# Patient Record
Sex: Male | Born: 1985 | Race: White | Hispanic: No | Marital: Single | Smoking: Current every day smoker
Health system: Southern US, Community
[De-identification: ages and names within clinical notes are randomized; demographics above are authoritative.]

## PROBLEM LIST (undated history)

## (undated) DIAGNOSIS — J45909 Unspecified asthma, uncomplicated: Secondary | ICD-10-CM

---

## 2006-10-10 ENCOUNTER — Emergency Department: Payer: Self-pay | Admitting: Emergency Medicine

## 2007-10-21 ENCOUNTER — Emergency Department: Payer: Self-pay | Admitting: Emergency Medicine

## 2008-11-06 ENCOUNTER — Emergency Department: Payer: Self-pay | Admitting: Emergency Medicine

## 2010-08-04 ENCOUNTER — Emergency Department: Payer: Self-pay | Admitting: Emergency Medicine

## 2011-03-31 ENCOUNTER — Emergency Department: Payer: Self-pay | Admitting: Emergency Medicine

## 2011-12-10 ENCOUNTER — Emergency Department: Payer: Self-pay | Admitting: Emergency Medicine

## 2012-04-10 IMAGING — CR DG FOOT COMPLETE 3+V*L*
1 series · 3 of 3 positions shown · non-contrast
Comparison: none

REASON FOR EXAM: trauma
COMMENTS:

PROCEDURE:     DXR - DXR FOOT LT COMP W/OBLIQUES  - March 31, 2011  [DATE]
RESULT:     Images of the left second foot demonstrate no fracture,
dislocation or radiopaque foreign body.

[Series 1: view not recorded · 0.17mm/px · 3 of 3 slices shown]
[im 1/3]
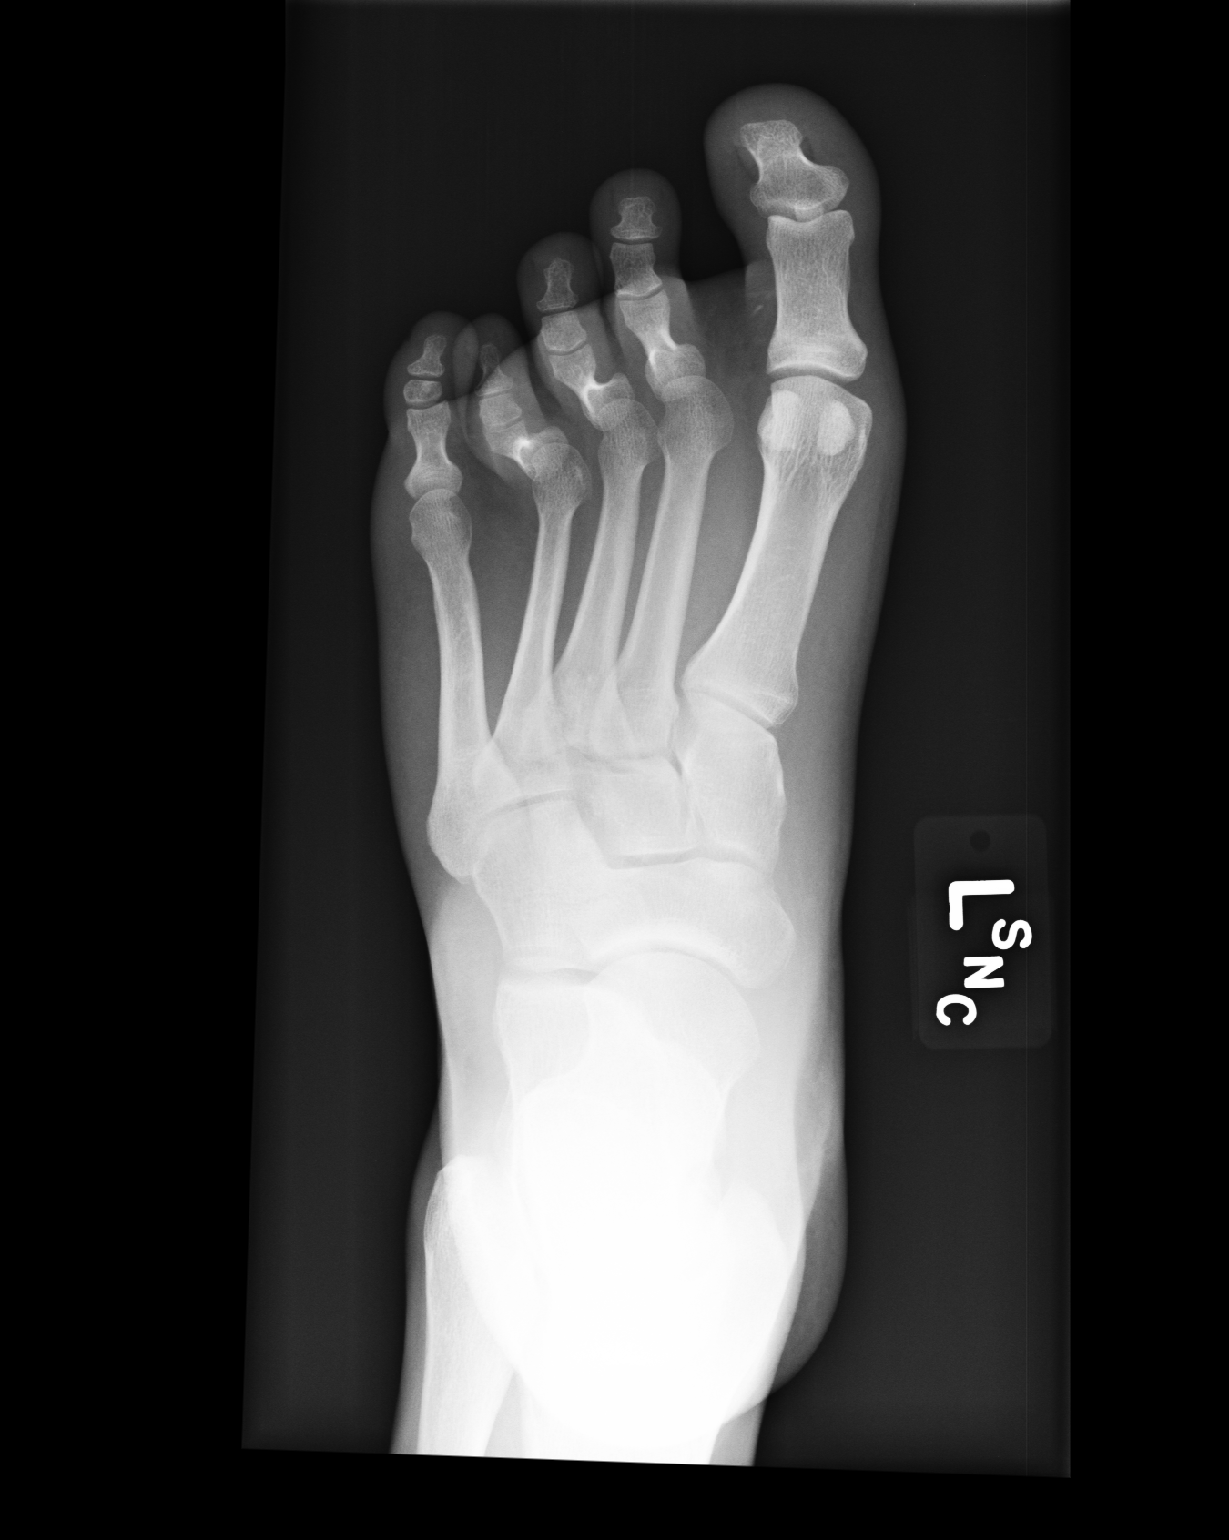
[im 2/3]
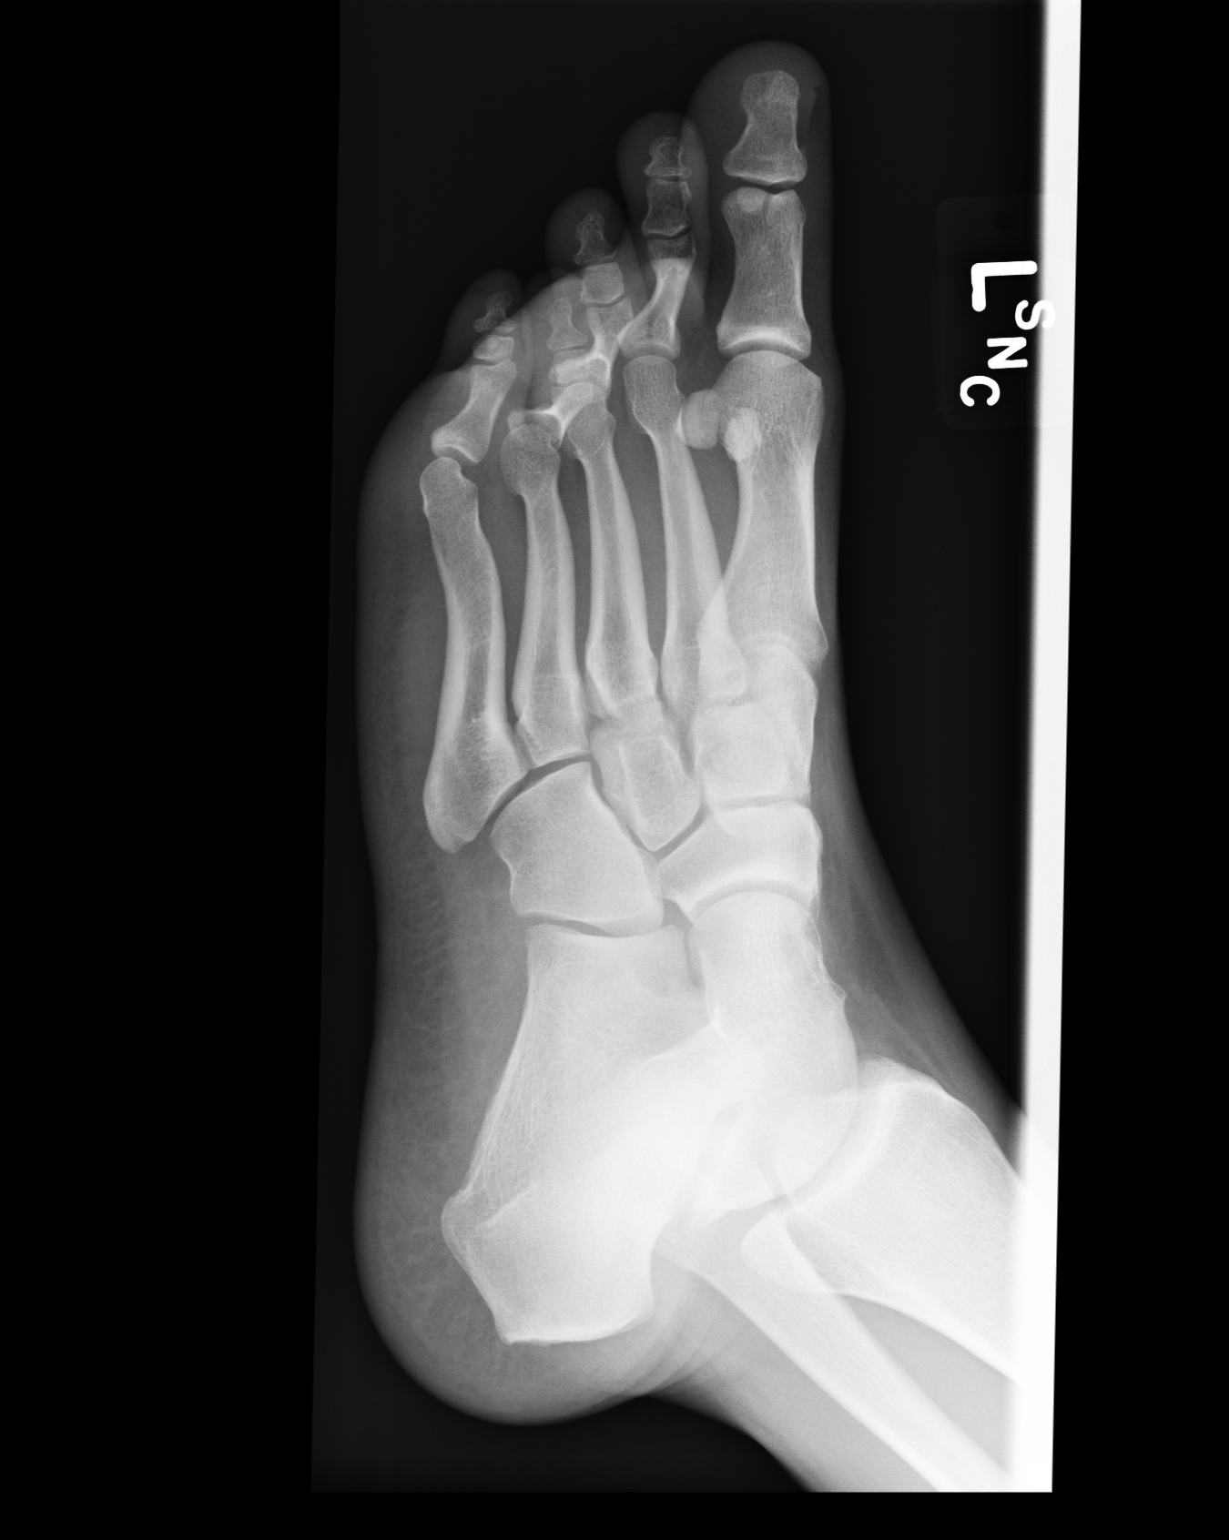
[im 3/3]
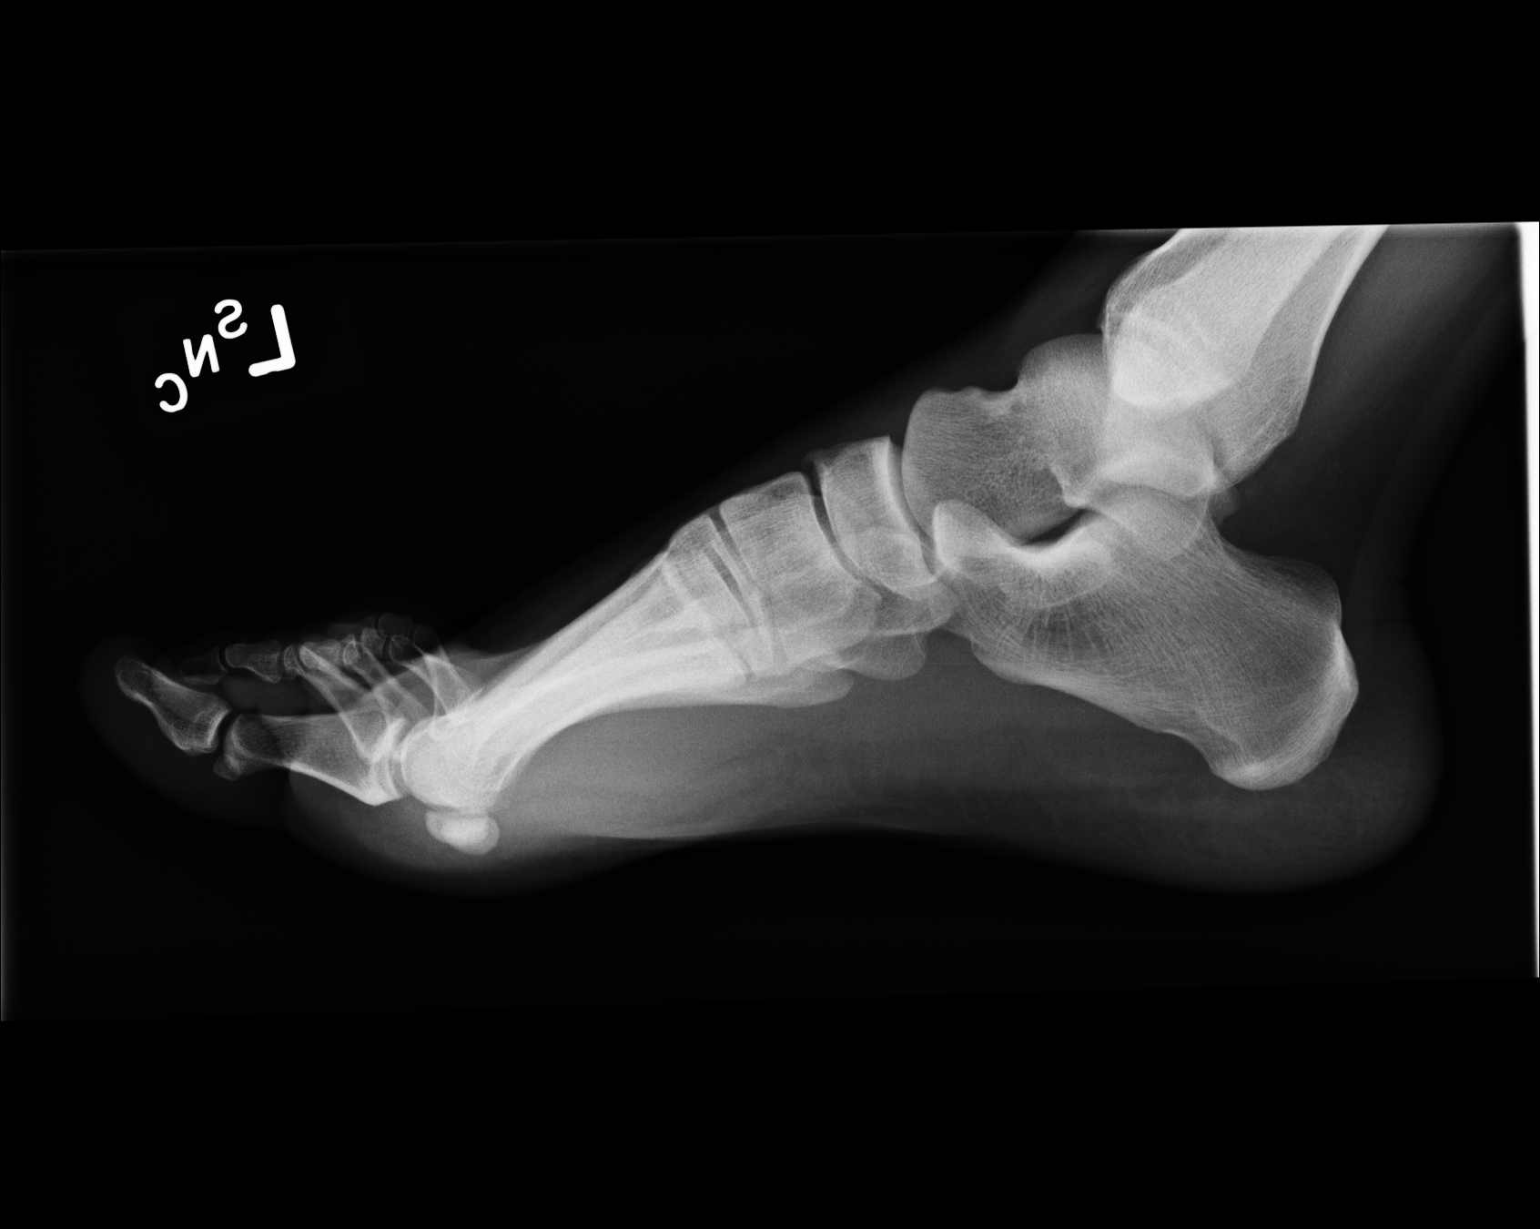

[3 of 3 positions shown; findings below may reference images not displayed]

IMPRESSION: Please see above.

## 2013-10-02 ENCOUNTER — Emergency Department: Payer: Self-pay | Admitting: Emergency Medicine

## 2014-07-04 ENCOUNTER — Emergency Department: Payer: Self-pay | Admitting: Emergency Medicine

## 2015-08-16 ENCOUNTER — Emergency Department
Admission: EM | Admit: 2015-08-16 | Discharge: 2015-08-16 | Disposition: A | Discharge status: Home / Self Care | Payer: Self-pay | Attending: Emergency Medicine | Admitting: Emergency Medicine

## 2015-08-16 ENCOUNTER — Emergency Department: Payer: Self-pay

## 2015-08-16 DIAGNOSIS — J45909 Unspecified asthma, uncomplicated: Secondary | ICD-10-CM | POA: Insufficient documentation

## 2015-08-16 DIAGNOSIS — Z720 Tobacco use: Secondary | ICD-10-CM | POA: Insufficient documentation

## 2015-08-16 DIAGNOSIS — J159 Unspecified bacterial pneumonia: Secondary | ICD-10-CM | POA: Insufficient documentation

## 2015-08-16 DIAGNOSIS — J189 Pneumonia, unspecified organism: Secondary | ICD-10-CM

## 2015-08-16 HISTORY — DX: Unspecified asthma, uncomplicated: J45.909

## 2015-08-16 MED ORDER — AZITHROMYCIN 250 MG PO TABS: ORAL_TABLET | ORAL | Status: DC

## 2015-08-16 MED ORDER — BENZONATATE 100 MG PO CAPS: ORAL_CAPSULE | ORAL | Status: DC

## 2015-08-16 NOTE — Discharge Instructions (Signed)
Community-Acquired Pneumonia, Adult Pneumonia is an infection of the lungs. One type of pneumonia can happen while a person is in a hospital. A different type can happen when a person is not in a hospital (community-acquired pneumonia). It is easy for this kind to spread from person to person. It can spread to you if you breathe near an infected person who coughs or sneezes. Some symptoms include:  A dry cough.  A wet (productive) cough.  Fever.  Sweating.  Chest pain. HOME CARE  Take over-the-counter and prescription medicines only as told by your doctor.  Only take cough medicine if you are losing sleep.  If you were prescribed an antibiotic medicine, take it as told by your doctor. Do not stop taking the antibiotic even if you start to feel better.  Sleep with your head and neck raised (elevated). You can do this by putting a few pillows under your head, or you can sleep in a recliner.  Do not use tobacco products. These include cigarettes, chewing tobacco, and e-cigarettes. If you need help quitting, ask your doctor.  Drink enough water to keep your pee (urine) clear or pale yellow. A shot (vaccine) can help prevent pneumonia. Shots are often suggested for:  People older than 28 years of age.  People older than 28 years of age:  Who are having cancer treatment.  Who have long-term (chronic) lung disease.  Who have problems with their body's defense system (immune system). You may also prevent pneumonia if you take these actions:  Get the flu (influenza) shot every year.  Go to the dentist as often as told.  Wash your hands often. If soap and water are not available, use hand sanitizer. GET HELP IF:  You have a fever.  You lose sleep because your cough medicine does not help. GET HELP RIGHT AWAY IF:  You are short of breath and it gets worse.  You have more chest pain.  Your sickness gets worse. This is very serious if:  You are an older adult.  Your  body's defense system is weak.  You cough up blood.   This information is not intended to replace advice given to you by your health care provider. Make sure you discuss any questions you have with your health care provider.   Document Released: 04/06/2008 Document Revised: 07/10/2015 Document Reviewed: 02/13/2015 Elsevier Interactive Patient Education [employer]2016 Elsevier Inc.   Take antibiotics as directed. Zithromax is for 5 days although her stay in her system for 10 days. Tessalon as needed for cough. You should follow-up with [facility] acute care if any continued problems and for follow-up in 1-2 weeks.

## 2015-08-16 NOTE — ED Notes (Signed)
Pt c/o cough with congestion for the past week and has been taking OTC meds without relief

## 2015-08-16 NOTE — ED Provider Notes (Signed)
[facility] Emergency Department Provider Note  ____________________________________________  Time seen: Approximately 10:45 AM  I have reviewed the triage vital signs and the nursing notes.   HISTORY  Chief Complaint Cough  HPI Micheal Payne is a 29 y.o. male is here with complaint of cough and congestion for 2 weeks. Patient states that he's had green nasal congestion along with a productive cough. Patient states he has had chills but no fever. He has been taking over-the-counter medication without any improvement. He states in the past he has had both bronchitis and pneumonia. He denies any pain at this time and denies any chest pain.   Past Medical History  Diagnosis Date  . Asthma     There are no active problems to display for this patient.   History reviewed. No pertinent past surgical history.  Current Outpatient Rx  Name  Route  Sig  Dispense  Refill  . azithromycin (ZITHROMAX Z-PAK) 250 MG tablet      Take 2 tablets (500 mg) on  Day 1,  followed by 1 tablet (250 mg) once daily on Days 2 through 5.   6 each   0   . benzonatate (TESSALON PERLES) 100 MG capsule      Take 1-2 q 8 hours prn cough   40 capsule   0     Allergies Sulfa antibiotics  No family history on file.  Social History Social History  Substance Use Topics  . Smoking status: Current Every Day Smoker    Types: Cigarettes  . Smokeless tobacco: None  . Alcohol Use: Yes    Review of Systems Constitutional: No fever/positive chills Eyes: No visual changes. ENT: No sore throat. Cardiovascular: Denies chest pain. Respiratory: Denies shortness of breath. Gastrointestinal:   No nausea, no vomiting.  No diarrhea.   Musculoskeletal: Negative for back pain. Skin: Negative for rash. Neurological: Negative for headaches, focal weakness or numbness.  10-point ROS otherwise negative.  ____________________________________________   PHYSICAL EXAM:  VITAL SIGNS: ED  Triage Vitals  Enc Vitals Group     BP 08/16/15 1012 145/72 mmHg     Pulse Rate 08/16/15 1012 77     Resp 08/16/15 1012 18     Temp 08/16/15 1012 98.1 F (36.7 C)     Temp Source 08/16/15 1012 Oral     SpO2 08/16/15 1012 98 %     Weight 08/16/15 1012 190 lb (86.183 kg)     Height 08/16/15 1012 6' (1.829 m)     Head Cir --      Peak Flow --      Pain Score --      Pain Loc --      Pain Edu? --      Excl. in GC? --     Constitutional: Alert and oriented. Well appearing and in no acute distress. Eyes: Conjunctivae are normal. PERRL. EOMI. Head: Atraumatic. Nose: Moderate congestion/no visible rhinnorhea. Mouth/Throat: Mucous membranes are moist.  Oropharynx non-erythematous. Neck: No stridor.   Hematological/Lymphatic/Immunilogical: No cervical lymphadenopathy. Cardiovascular: Normal rate, regular rhythm. Grossly normal heart sounds.  Good peripheral circulation. Respiratory: Normal respiratory effort.  No retractions. Lungs CTAB with Coarse congested cough present. Gastrointestinal: Soft and nontender. No distention.  Musculoskeletal: No lower extremity tenderness nor edema.  No joint effusions. Neurologic:  Normal speech and language. No gross focal neurologic deficits are appreciated. No gait instability. Skin:  Skin is warm, dry and intact. No rash noted. Psychiatric: Mood and affect are normal. Speech  and behavior are normal.  ____________________________________________   LABS (all labs ordered are listed, but only abnormal results are displayed)  Labs Reviewed - No data to display RADIOLOGY  Chest x-ray per radiologist shows left lung base concerning for pneumonia. I, Tommi Rumpshonda L Summers, personally viewed and evaluated these images (plain radiographs) as part of my medical decision making.  ____________________________________________   PROCEDURES  Procedure(s) performed: None  Critical Care performed:  No  ____________________________________________   INITIAL IMPRESSION / ASSESSMENT AND PLAN / ED COURSE  Pertinent labs & imaging results that were available during my care of the patient were reviewed by me and considered in my medical decision making (see chart for details)  Patient will follow-up with [facility] in 1-2 weeks or return to the emergency room if any severe worsening of his symptoms. He was started on Zithromax and Tessalon pearls as needed for cough. He was also given a note for work.   ____________________________________________   FINAL CLINICAL IMPRESSION(S) / ED DIAGNOSES  Final diagnoses:  Community acquired pneumonia  left lower lung    Tommi RumpsRhonda L Summers, PA-C 08/16/15 1226  Sharyn CreamerMark Quale, MD 08/16/15 1504

## 2015-12-23 ENCOUNTER — Encounter: Payer: Self-pay | Admitting: *Deleted

## 2015-12-23 ENCOUNTER — Emergency Department
Admission: EM | Admit: 2015-12-23 | Discharge: 2015-12-23 | Disposition: A | Discharge status: Home / Self Care | Payer: Self-pay | Attending: Emergency Medicine | Admitting: Emergency Medicine

## 2015-12-23 ENCOUNTER — Emergency Department: Payer: Self-pay

## 2015-12-23 DIAGNOSIS — M25462 Effusion, left knee: Secondary | ICD-10-CM | POA: Insufficient documentation

## 2015-12-23 DIAGNOSIS — F17210 Nicotine dependence, cigarettes, uncomplicated: Secondary | ICD-10-CM | POA: Insufficient documentation

## 2015-12-23 MED ORDER — IBUPROFEN 800 MG PO TABS: 800.0000 mg | ORAL_TABLET | Freq: Three times a day (TID) | ORAL | Status: AC | PRN

## 2015-12-23 MED ORDER — IBUPROFEN 800 MG PO TABS
800.0000 mg | ORAL_TABLET | Freq: Once | ORAL | Status: AC
  Administered 2015-12-23: 800 mg via ORAL
  Filled 2015-12-23: qty 1

## 2015-12-23 NOTE — ED Provider Notes (Signed)
[facility] Emergency Department Provider Note  ____________________________________________  Time seen: Approximately 2:02 PM  I have reviewed the triage vital signs and the nursing notes.   HISTORY  Chief Complaint Leg Pain    HPI Micheal Payne is a 30 y.o. male who presents for evaluation of left knee swollen moderately. Patient states that he was playing disc office today woke up with severe left knee pain. Patient states he usually has high tolerance to this but more than tolerated. Has not tried anything over-the-counter at this moment. Pain worse with ambulation and movement. Describes pain as a 7/10 nonradiating.   Past Medical History  Diagnosis Date  . Asthma     There are no active problems to display for this patient.   History reviewed. No pertinent past surgical history.  Current Outpatient Rx  Name  Route  Sig  Dispense  Refill  . ibuprofen (ADVIL,MOTRIN) 800 MG tablet   Oral   Take 1 tablet (800 mg total) by mouth every 8 (eight) hours as needed.   30 tablet   0     Allergies Sulfa antibiotics  No family history on file.  Social History Social History  Substance Use Topics  . Smoking status: Current Every Day Smoker    Types: Cigarettes  . Smokeless tobacco: None  . Alcohol Use: Yes    Review of Systems Constitutional: No fever/chills Musculoskeletal: Positive for left knee pain. Skin: Negative for rash. Neurological: Negative for headaches, focal weakness or numbness.  10-point ROS otherwise negative.  ____________________________________________   PHYSICAL EXAM:  VITAL SIGNS: ED Triage Vitals  Enc Vitals Group     BP 12/23/15 1332 130/78 mmHg     Pulse Rate 12/23/15 1332 78     Resp 12/23/15 1332 20     Temp 12/23/15 1332 97.7 F (36.5 C)     Temp Source 12/23/15 1332 Oral     SpO2 12/23/15 1332 99 %     Weight 12/23/15 1332 190 lb (86.183 kg)     Height 12/23/15 1332  (1.854 m)     Head Cir --       Peak Flow --      Pain Score 12/23/15 1333 7     Pain Loc --      Pain Edu? --      Excl. in GC? --     Constitutional: Alert and oriented. Well appearing and in no acute distress. Cardiovascular: Normal rate, regular rhythm. Grossly normal heart sounds.  Good peripheral circulation. Respiratory: Normal respiratory effort.  No retractions. Lungs CTAB. Musculoskeletal: No lower extremity tenderness nor edema.  No joint effusions. Neurologic:  Normal speech and language. No gross focal neurologic deficits are appreciated. No gait instability. Skin:  Skin is warm, dry and intact. No rash noted. Psychiatric: Mood and affect are normal. Speech and behavior are normal.  ____________________________________________   LABS (all labs ordered are listed, but only abnormal results are displayed)  Labs Reviewed - No data to display  RADIOLOGY  FINDINGS: The joint spaces are maintained. Minimal spurring changes noted off the lateral tibial plateau. No osteochondral abnormality or acute fracture. There is a small suprapatellar knee joint effusion noted.  IMPRESSION: No acute bony findings.  Minimal lateral spurring changes.  Small joint effusion. ____________________________________________   PROCEDURES  Procedure(s) performed: None  Critical Care performed: No  ____________________________________________   INITIAL IMPRESSION / ASSESSMENT AND PLAN / ED COURSE  Pertinent labs & imaging results that were available during  my care of the patient were reviewed by me and considered in my medical decision making (see chart for details).  Acute left knee effusion. Rx given for crutches to help with ambulation. Avoid excessive use of weightbearing. Work excuse 24 hours given. Patient started on Motrin 800 mg 3 times a day and referral to orthopedics on-call for follow-up. ____________________________________________   FINAL CLINICAL IMPRESSION(S) / ED DIAGNOSES  Final  diagnoses:  Knee effusion, left     This chart was dictated using voice recognition software/Dragon. Despite best efforts to proofread, errors can occur which can change the meaning. Any change was purely unintentional.   Evangeline Dakin, PA-C 12/23/15 1515  Governor Rooks, MD 12/23/15 [phone]

## 2015-12-23 NOTE — ED Notes (Signed)
Left knee swollen moderately.  Patient states pain goes around knee.

## 2015-12-23 NOTE — Discharge Instructions (Signed)
Knee Effusion °Knee effusion means that you have excess fluid in your knee joint. This can cause pain and swelling in your knee. This may make your knee more difficult to bend and move. That is because there is increased pain and pressure in the joint. If there is fluid in your knee, it often means that something is wrong inside your knee, such as severe arthritis, abnormal inflammation, or an infection. Another common cause of knee effusion is an injury to the knee muscles, ligaments, or cartilage. °HOME CARE INSTRUCTIONS °· Use crutches as directed by your health care provider. °· Wear a knee brace as directed by your health care provider. °· Apply ice to the swollen area: °¨ Put ice in a plastic bag. °¨ Place a towel between your skin and the bag. °¨ Leave the ice on for 20 minutes, 2-3 times per day. °· Keep your knee raised (elevated) when you are sitting or lying down. °· Take medicines only as directed by your health care provider. °· Do any rehabilitation or strengthening exercises as directed by your health care provider. °· Rest your knee as directed by your health care provider. You may start doing your normal activities again when your health care provider approves.    °· Keep all follow-up visits as directed by your health care provider. This is important. °SEEK MEDICAL CARE IF: °· You have ongoing (persistent) pain in your knee. °SEEK IMMEDIATE MEDICAL CARE IF: °· You have increased swelling or redness of your knee. °· You have severe pain in your knee. °· You have a fever. °  °This information is not intended to replace advice given to you by your health care provider. Make sure you discuss any questions you have with your health care provider. °  °Document Released: 01/09/2004 Document Revised: 11/09/2014 Document Reviewed: 06/04/2014 °Elsevier Interactive Patient Education ©2016 Elsevier Inc. ° °

## 2015-12-23 NOTE — ED Notes (Signed)
Pt was playing disc golf yesterday , woke up today with left leg pain

## 2016-03-01 ENCOUNTER — Emergency Department
Admission: EM | Admit: 2016-03-01 | Discharge: 2016-03-01 | Disposition: A | Discharge status: Home / Self Care | Payer: Self-pay | Attending: Emergency Medicine | Admitting: Emergency Medicine

## 2016-03-01 DIAGNOSIS — J45909 Unspecified asthma, uncomplicated: Secondary | ICD-10-CM | POA: Insufficient documentation

## 2016-03-01 DIAGNOSIS — Y999 Unspecified external cause status: Secondary | ICD-10-CM | POA: Insufficient documentation

## 2016-03-01 DIAGNOSIS — F17210 Nicotine dependence, cigarettes, uncomplicated: Secondary | ICD-10-CM | POA: Insufficient documentation

## 2016-03-01 DIAGNOSIS — W57XXXA Bitten or stung by nonvenomous insect and other nonvenomous arthropods, initial encounter: Secondary | ICD-10-CM | POA: Insufficient documentation

## 2016-03-01 DIAGNOSIS — Y939 Activity, unspecified: Secondary | ICD-10-CM | POA: Insufficient documentation

## 2016-03-01 DIAGNOSIS — Y929 Unspecified place or not applicable: Secondary | ICD-10-CM | POA: Insufficient documentation

## 2016-03-01 DIAGNOSIS — S70361A Insect bite (nonvenomous), right thigh, initial encounter: Secondary | ICD-10-CM | POA: Insufficient documentation

## 2016-03-01 LAB — CBC WITH DIFFERENTIAL/PLATELET
BASOS PCT: 1 %
Basophils Absolute: 0.1 10*3/uL (ref 0–0.1)
EOS PCT: 1 %
Eosinophils Absolute: 0.1 10*3/uL (ref 0–0.7)
HCT: 45.2 % (ref 40.0–52.0)
Hemoglobin: 15.7 g/dL (ref 13.0–18.0)
LYMPHS ABS: 1.8 10*3/uL (ref 1.0–3.6)
Lymphocytes Relative: 29 %
MCH: 32.5 pg (ref 26.0–34.0)
MCHC: 34.8 g/dL (ref 32.0–36.0)
MCV: 93.2 fL (ref 80.0–100.0)
MONO ABS: 0.9 10*3/uL (ref 0.2–1.0)
Monocytes Relative: 15 %
Neutro Abs: 3.4 10*3/uL (ref 1.4–6.5)
Neutrophils Relative %: 54 %
PLATELETS: 89 10*3/uL — AB (ref 150–440)
RBC: 4.85 MIL/uL (ref 4.40–5.90)
RDW: 13 % (ref 11.5–14.5)
WBC: 6.3 10*3/uL (ref 3.8–10.6)

## 2016-03-01 LAB — COMPREHENSIVE METABOLIC PANEL
ALK PHOS: 71 U/L (ref 38–126)
ALT: 61 U/L (ref 17–63)
AST: 47 U/L — AB (ref 15–41)
Albumin: 4.5 g/dL (ref 3.5–5.0)
Anion gap: 10 (ref 5–15)
BUN: 13 mg/dL (ref 6–20)
CHLORIDE: 105 mmol/L (ref 101–111)
CO2: 28 mmol/L (ref 22–32)
CREATININE: 0.89 mg/dL (ref 0.61–1.24)
Calcium: 9.5 mg/dL (ref 8.9–10.3)
GFR calc Af Amer: >60 mL/min (ref >60)
Glucose, Bld: 82 mg/dL (ref 65–99)
Potassium: 4.4 mmol/L (ref 3.5–5.1)
Sodium: 143 mmol/L (ref 135–145)
Total Bilirubin: 1 mg/dL (ref 0.3–1.2)
Total Protein: 7.6 g/dL (ref 6.5–8.1)

## 2016-03-01 NOTE — ED Notes (Signed)
MD at bedside. 

## 2016-03-01 NOTE — ED Notes (Signed)
Patient stated that he took Goody's powder for pain and fever one hour prior to arrival.

## 2016-03-01 NOTE — ED Notes (Signed)
Patient presents to the ED with fever, headache and body aches since he pulled a tick off himself on Tuesday.  Patient states tick was very small.  Patient is in no obvious distress at this time.

## 2016-03-01 NOTE — ED Provider Notes (Signed)
[facility] Emergency Department Provider Note   ____________________________________________  Time seen: Approximately 1:52 PM  I have reviewed the triage vital signs and the nursing notes.   HISTORY  Chief Complaint Fever and Headache    HPI Micheal Payne is a 30 y.o. male patient reports he pulled a small tick off of himself on the inner upper thigh on the right Tuesday which was 6 days ago. Since then he has developed diffuse muscle aches headache and a fever. The fevers only 99 here but still he's had that off and on since. Where he pulled a tick off is not infected there is no sign of any erythema he said he did pinch the area to make it stop itching and there is a bruise from the pinch. Patient also says she thought a bug flew into his right ear yesterday it has been bothering him since he wanted me to make sure there wasn't anything in there.   Past Medical History  Diagnosis Date  . Asthma     There are no active problems to display for this patient.   No past surgical history on file.  Current Outpatient Rx  Name  Route  Sig  Dispense  Refill  . ibuprofen (ADVIL,MOTRIN) 800 MG tablet   Oral   Take 1 tablet (800 mg total) by mouth every 8 (eight) hours as needed.   30 tablet   0     Allergies Sulfa antibiotics  No family history on file.  Social History Social History  Substance Use Topics  . Smoking status: Current Every Day Smoker    Types: Cigarettes  . Smokeless tobacco: Not on file  . Alcohol Use: Yes    Review of Systems Constitutional: fever Eyes: No visual changes. ENT: No sore throat. Cardiovascular: Denies chest pain. Respiratory: Denies shortness of breath. Gastrointestinal:Mild abdominal pain.  No nausea, no vomiting.  No diarrhea.  No constipation. Genitourinary: Negative for dysuria. Musculoskeletal: Mild diffuse back pain. Skin: Negative for rash. Neurological: Mild headache headaches,   10-point ROS  otherwise negative.  ____________________________________________   PHYSICAL EXAM:  VITAL SIGNS: ED Triage Vitals  Enc Vitals Group     BP 03/01/16 1149 131/77 mmHg     Pulse Rate 03/01/16 1149 74     Resp 03/01/16 1149 18     Temp 03/01/16 1149 99.2 F (37.3 C)     Temp Source 03/01/16 1149 Oral     SpO2 03/01/16 1149 98 %     Weight 03/01/16 1149 200 lb (90.719 kg)     Height 03/01/16 1149  (1.854 m)     Head Cir --      Peak Flow --      Pain Score --      Pain Loc --      Pain Edu? --      Excl. in GC? --     Constitutional: Alert and oriented. Well appearing and in no acute distress. Eyes: Conjunctivae are normal. PERRL. EOMI. Head: Atraumatic. Nose: No congestion/rhinnorhea. Ears: Left TM is clear ear canal looks normal. Right TM is clear there is a small round white mass about a millimeter in diameter at the bottom of the ear canal. This was touched with the back end of the Q-tip. It in been did easily. I could not wipe it out with a Q-tip with the cotton and therefore I used the blunt wooden and and popped a small pimple there and then clean the  area with alcohol wipe put an alcohol wipe in the ear afterwards I will give him some Auralgan. Mouth/Throat: Mucous membranes are moist.  Oropharynx non-erythematous. Neck: No stridor. Patient has a small node at the right angle of the jaw which is slightly tender. Less than a centimeter.  Cardiovascular: Normal rate, regular rhythm. Grossly normal heart sounds.  Good peripheral circulation. Respiratory: Normal respiratory effort.  No retractions. Lungs CTAB. Gastrointestinal: Soft and nontender. No distention. No abdominal bruits. No CVA tenderness. Musculoskeletal: No lower extremity tenderness nor edema.  No joint effusions. Neurologic:  Normal speech and language. No gross focal neurologic deficits are appreciated. No gait instability. Skin:  Skin is warm, dry and intact  except as described in the area of the tick  bite left upper thigh  Psychiatric: Mood and affect are normal. Speech and behavior are normal.  ____________________________________________   LABS (all labs ordered are listed, but only abnormal results are displayed)  Labs Reviewed  COMPREHENSIVE METABOLIC PANEL - Abnormal; Notable for the following:    AST 47 (*)    All other components within normal limits  CBC WITH DIFFERENTIAL/PLATELET - Abnormal; Notable for the following:    Platelets 89 (*)    All other components within normal limits  ROCKY MTN SPOTTED FVR ABS PNL(IGG+IGM)   ____________________________________________  EKG   ____________________________________________  RADIOLOGY   ____________________________________________   PROCEDURES   ____________________________________________   INITIAL IMPRESSION / ASSESSMENT AND PLAN / ED COURSE  Pertinent labs & imaging results that were available during my care of the patient were reviewed by me and considered in my medical decision making (see chart for details).   ____________________________________________   FINAL CLINICAL IMPRESSION(S) / ED DIAGNOSES  Final diagnoses:  Tick bite      NEW MEDICATIONS STARTED DURING THIS VISIT:  Discharge Medication List as of 03/01/2016  2:01 PM       Note:  This document was prepared using Dragon voice recognition software and may include unintentional dictation errors.    Arnaldo NatalPaul F Malinda, MD 03/01/16 [phone]

## 2016-03-03 LAB — ROCKY MTN SPOTTED FVR ABS PNL(IGG+IGM)
RMSF IGM: 0.54 {index} (ref 0.00–0.89)
RMSF IgG: UNDETERMINED

## 2016-03-03 LAB — RMSF, IGG, IFA

## 2016-03-05 ENCOUNTER — Telehealth: Payer: Self-pay | Admitting: Emergency Medicine

## 2016-03-05 NOTE — ED Notes (Signed)
Called patietn to inform of equivical rmsf test and need for treatment with doxycycline.  He says that dr Darnelle Catalanmalinda gave him a written prescription for the doxycycline and he is taking it.

## 2017-01-02 IMAGING — CR DG KNEE COMPLETE 4+V*L*
1 series · 4 of 4 positions shown · non-contrast
Comparison: None.

CLINICAL DATA: Twisting injury yesterday.  Unable to bear weight.

EXAM:
LEFT KNEE - COMPLETE 4+ VIEW

[Series 1: ap · 0.17mm/px · 4 of 4 slices shown]
[im 1/4]
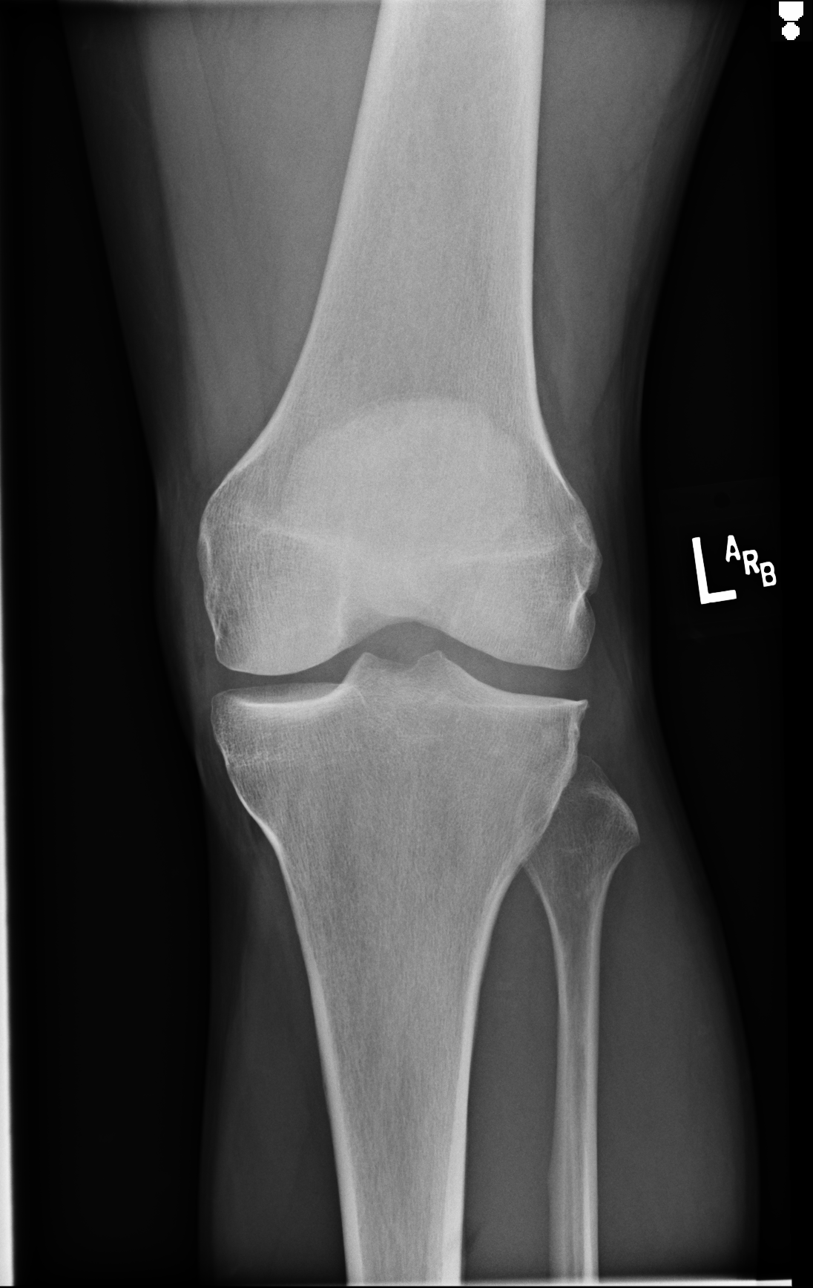
[im 2/4]
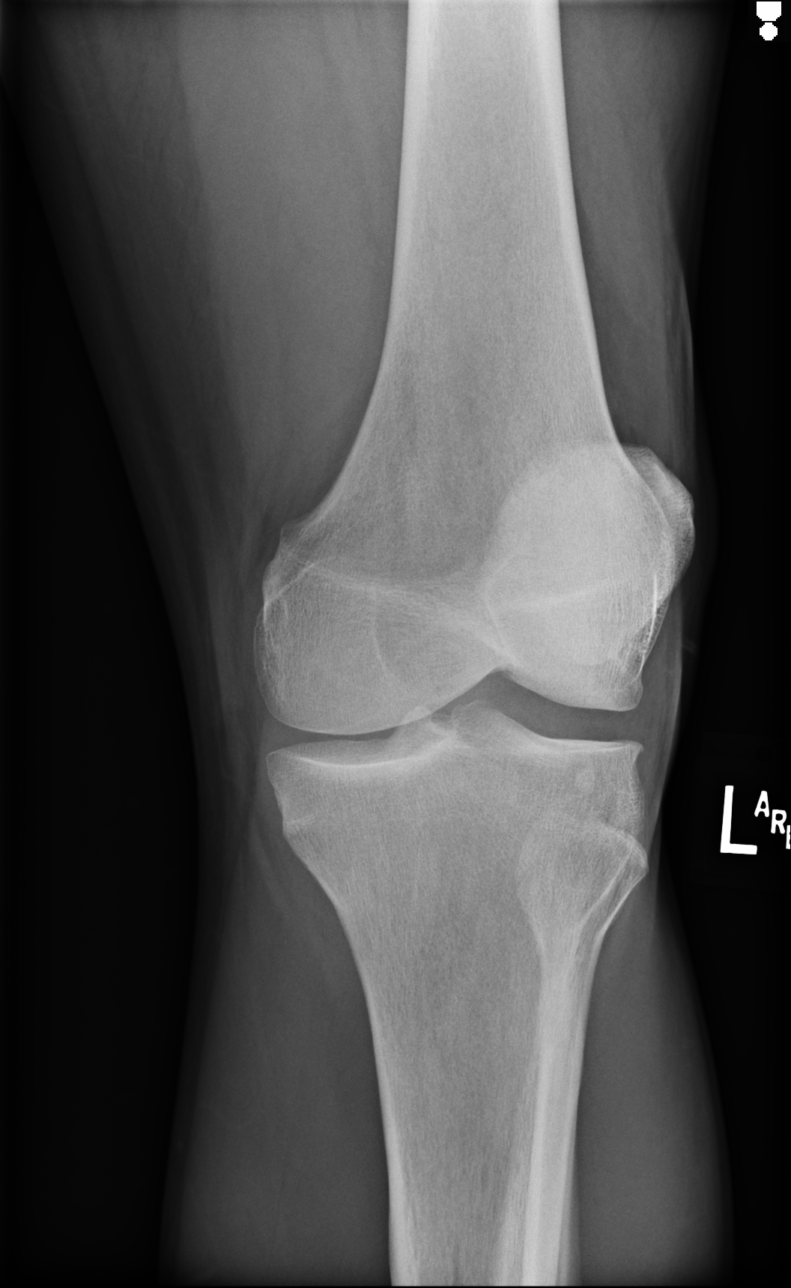
[im 3/4]
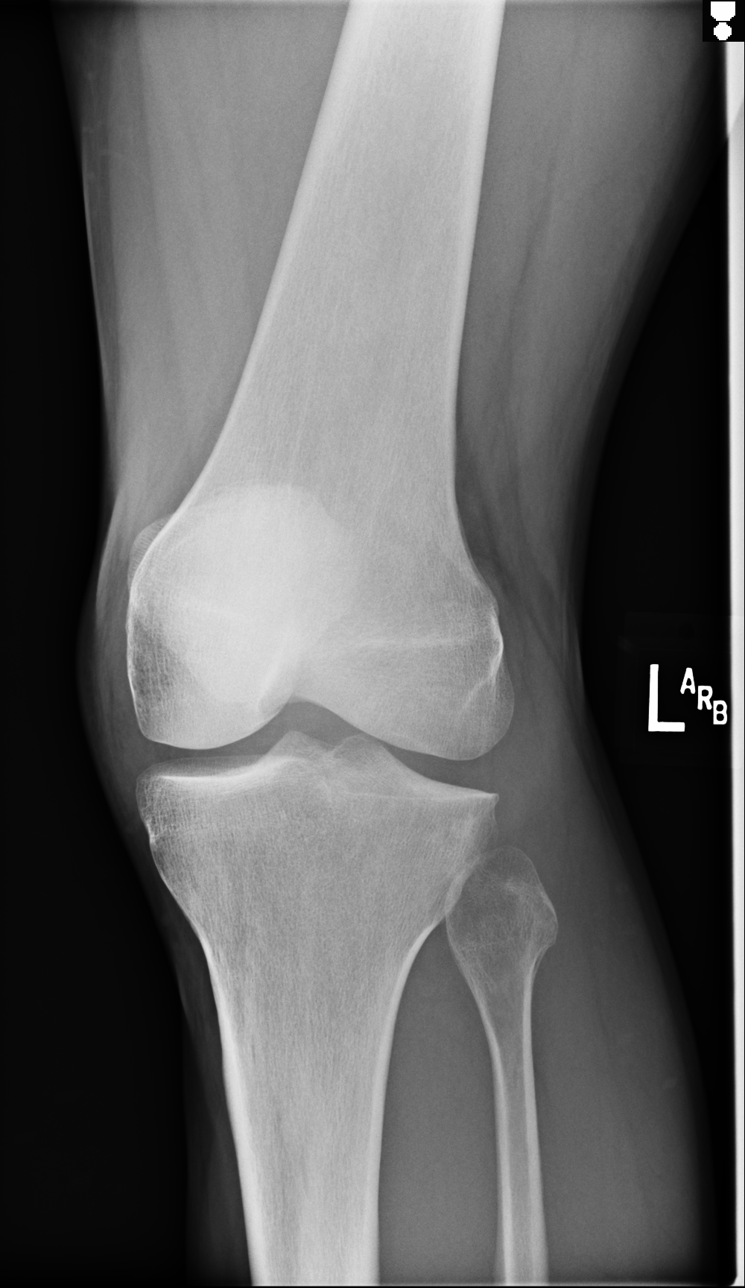
[im 4/4]
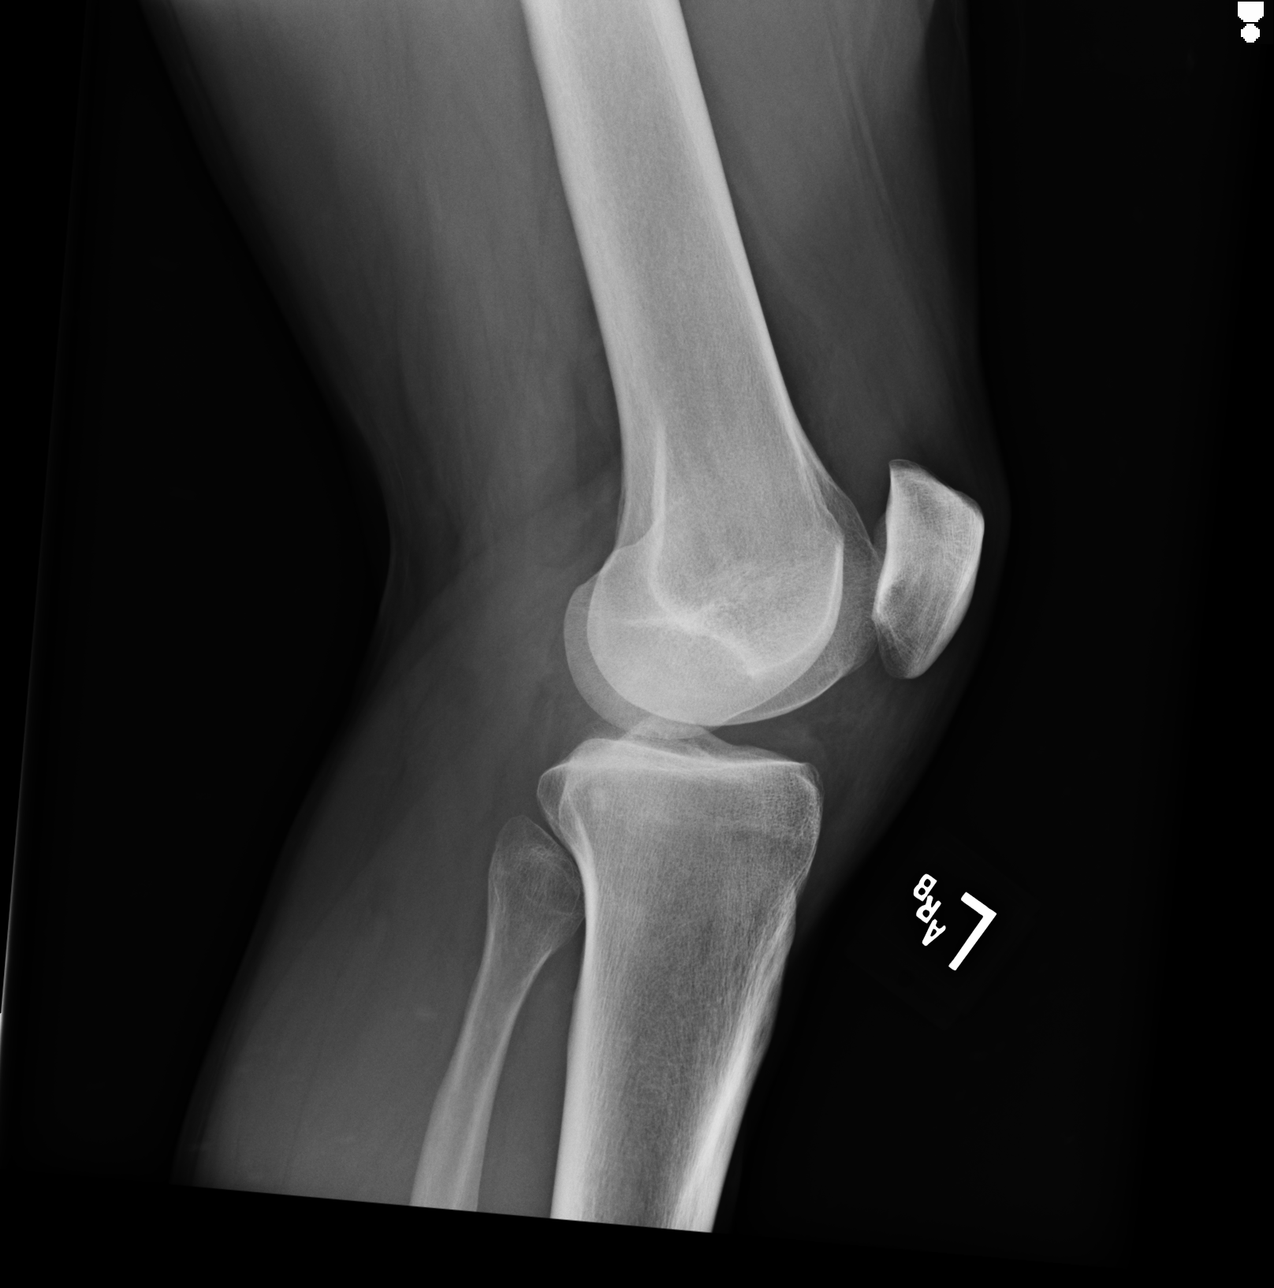

[4 of 4 positions shown; findings below may reference images not displayed]

FINDINGS: The joint spaces are maintained. Minimal spurring changes noted off
the lateral tibial plateau. No osteochondral abnormality or acute
fracture. There is a small suprapatellar knee joint effusion noted.
IMPRESSION: No acute bony findings.

Minimal lateral spurring changes.

Small joint effusion.

## 2019-08-01 ENCOUNTER — Other Ambulatory Visit: Payer: Self-pay

## 2019-08-01 DIAGNOSIS — Z20822 Contact with and (suspected) exposure to covid-19: Secondary | ICD-10-CM

## 2019-08-02 LAB — NOVEL CORONAVIRUS, NAA: SARS-CoV-2, NAA: NOT DETECTED
